# Patient Record
Sex: Female | Born: 2000 | Race: White | Hispanic: No | Marital: Single | Smoking: Never smoker
Health system: Southern US, Community
[De-identification: ages and names within clinical notes are randomized; demographics above are authoritative.]

## PROBLEM LIST (undated history)

## (undated) DIAGNOSIS — F32A Depression, unspecified: Secondary | ICD-10-CM

## (undated) DIAGNOSIS — F329 Major depressive disorder, single episode, unspecified: Secondary | ICD-10-CM

## (undated) DIAGNOSIS — F419 Anxiety disorder, unspecified: Secondary | ICD-10-CM

## (undated) DIAGNOSIS — J45909 Unspecified asthma, uncomplicated: Secondary | ICD-10-CM

## (undated) HISTORY — PX: OTHER SURGICAL HISTORY: SHX169

---
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
## 2022-02-01 ENCOUNTER — Other Ambulatory Visit (HOSPITAL_COMMUNITY): Payer: Self-pay | Admitting: Psychiatry

## 2022-02-03 ENCOUNTER — Other Ambulatory Visit (HOSPITAL_COMMUNITY): Payer: Self-pay | Admitting: Psychiatry

## 2022-02-05 ENCOUNTER — Other Ambulatory Visit (HOSPITAL_COMMUNITY): Payer: Self-pay

## 2022-02-05 MED ORDER — VENLAFAXINE HCL ER 37.5 MG PO CP24: ORAL_CAPSULE | ORAL | 0 refills | Status: DC

## 2022-03-04 ENCOUNTER — Other Ambulatory Visit (HOSPITAL_COMMUNITY): Payer: Self-pay | Admitting: Psychiatry

## 2022-04-05 ENCOUNTER — Encounter (HOSPITAL_COMMUNITY): Payer: Self-pay | Admitting: Psychiatry

## 2022-04-05 ENCOUNTER — Telehealth (INDEPENDENT_AMBULATORY_CARE_PROVIDER_SITE_OTHER): Payer: Medicaid Other | Admitting: Psychiatry

## 2022-04-05 DIAGNOSIS — F4322 Adjustment disorder with anxiety: Secondary | ICD-10-CM

## 2022-04-05 DIAGNOSIS — F331 Major depressive disorder, recurrent, moderate: Secondary | ICD-10-CM | POA: Diagnosis not present

## 2022-04-05 DIAGNOSIS — F411 Generalized anxiety disorder: Secondary | ICD-10-CM

## 2022-04-05 NOTE — Progress Notes (Signed)
[facility] Follow up visit   Patient Identification: Stephanie Jackson MRN:  [id number] Date of Evaluation:  04/05/2022 Referral Source: dr. Milana Kidney Chief Complaint:  follow up , med review and anxiety Visit Diagnosis:    ICD-10-CM   1. Moderate episode of recurrent major depressive disorder (HCC)  F33.1     2. Generalized anxiety disorder  F41.1     3. Adjustment disorder with anxious mood  F43.22     Virtual Visit via Video Note  I connected with [name] on 04/05/22 at 12:00 PM EDT by a video enabled telemedicine application and verified that I am speaking with the correct person using two identifiers.  Location: Patient: home Provider: home office   I discussed the limitations of evaluation and management by telemedicine and the availability of in person appointments. The patient expressed understanding and agreed to proceed.     I discussed the assessment and treatment plan with the patient. The patient was provided an opportunity to ask questions and all were answered. The patient agreed with the plan and demonstrated an understanding of the instructions.   The patient was advised to call back or seek an in-person evaluation if the symptoms worsen or if the condition fails to improve as anticipated.  I provided 15 minutes of non-face-to-face time during this encounter.    History of Present Illness: Patient is 21 years old female who lives with her mom and stepdad referred by Dr. Milana Kidney as she turns age 68 diagnosed with depressive disorder and anxiety along with adjustment  She plans to join [employer] college currently working part time.  Overall doing fair, expecting a baby sibling next month Some delayed sleep and goes to bed late, takes vistaril   Relationship with step dad is better   Aggravating factors; difficult childhood, she also had a miscarriage last year  Modifying factors; mom and step dad  Duration since age [age]      Past Psychiatric  History: depression, anxiety  Previous Psychotropic Medications: Yes   Substance Abuse History in the last 12 months:  No.  Consequences of Substance Abuse: NA  Past Medical History:  Past Medical History:  Diagnosis Date   Anxiety    Asthma    Depression     Past Surgical History:  Procedure Laterality Date   ureteral dilation      Family Psychiatric History: mom : bipolar  Family History: No family history on file.  Social History:   Social History   Socioeconomic History   Marital status: Single    Spouse name: Not on file   Number of children: Not on file   Years of education: Not on file   Highest education level: Not on file  Occupational History   Not on file  Tobacco Use   Smoking status: Never   Smokeless tobacco: Never  Vaping Use   Vaping Use: Never used  Substance and Sexual Activity   Alcohol use: No   Drug use: No   Sexual activity: Never  Other Topics Concern   Not on file  Social History Narrative   Not on file   Social Determinants of Health   Financial Resource Strain: Not on file  Food Insecurity: Not on file  Transportation Needs: Not on file  Physical Activity: Not on file  Stress: Not on file  Social Connections: Not on file     Allergies:  No Known Allergies  Metabolic Disorder Labs: No results found for: "HGBA1C", "MPG" No results found  for: "PROLACTIN" No results found for: "CHOL", "TRIG", "HDL", "CHOLHDL", "VLDL", "LDLCALC" No results found for: "TSH"  Therapeutic Level Labs: No results found for: "LITHIUM" No results found for: "CBMZ" No results found for: "VALPROATE"  Current Medications: Current Outpatient Medications  Medication Sig Dispense Refill   albuterol (VENTOLIN HFA) 108 (90 Base) MCG/ACT inhaler Inhale 2 puffs into the lungs every 4 (four) hours as needed for wheezing or shortness of breath (cough, shortness of breath or wheezing.). 6.7 g 3   hydrOXYzine (VISTARIL) 25 MG capsule TAKE 1 CAPSULE BY  MOUTH 3 TIMES DAILY AS NEEDED FOR ANXIETY. 90 capsule 0   NIKKI 3-0.02 MG tablet Take 1 tablet by mouth daily.     ondansetron (ZOFRAN-ODT) 8 MG disintegrating tablet Take 1 tablet (8 mg total) by mouth every 8 (eight) hours as needed for nausea. 12 tablet 0   venlafaxine XR (EFFEXOR-XR) 37.5 MG 24 hr capsule Take one each morning with 75mg  capsule for 112.5mg  90 capsule 0   venlafaxine XR (EFFEXOR-XR) 75 MG 24 hr capsule TAKE ON E DAY, IN ADDITION TO 37.5MG  90 capsule 0   No current facility-administered medications for this visit.     Psychiatric Specialty Exam: Review of Systems  Cardiovascular:  Negative for chest pain.  Psychiatric/Behavioral:  Negative for agitation, hallucinations and sleep disturbance.     There were no vitals taken for this visit.There is no height or weight on file to calculate BMI.  General Appearance: Casual  Eye Contact:  Fair  Speech:  Normal Rate  Volume:  Decreased  Mood:  Euthymic  Affect:  Constricted  Thought Process:  Goal Directed  Orientation:  Full (Time, Place, and Person)  Thought Content:  Rumination  Suicidal Thoughts:  No  Homicidal Thoughts:  No  Memory:  Immediate;   Fair  Judgement:  Fair  Insight:  Fair  Psychomotor Activity:  Normal  Concentration:  Concentration: Good  Recall:  Good  Fund of Knowledge:Good  Language: Fair  Akathisia:  No  Handed:    AIMS (if indicated):  not done  Assets:  Desire for Improvement Housing Physical Health  ADL's:  Intact  Cognition: WNL  Sleep:  Good   Screenings: GAD-7    Flowsheet Row Office Visit from 09/11/2017 in [facility] [place]  Total GAD-7 Score 12      PHQ2-9    Flowsheet Row Office Visit from 05/24/2021 in [facility] AT [place] Video Visit from 12/13/2020 in [facility] AT [place] Office Visit from 09/11/2017 in [facility] AT [place]  PHQ-2 Total Score 1 4  1   PHQ-9 Total Score -- 11 --      Flowsheet Row Video Visit from 04/05/2022 in [facility] AT [place] Video Visit from 12/07/2021 in [facility] AT [place] Office Visit from 05/24/2021 in [facility] AT [place]  C-SSRS RISK CATEGORY No Risk No Risk No Risk       Assessment and Plan: as follows  Prior documentation reviewed  Major depressive disorder recurrent moderate; in partial remission  Doing fair continue wellbutrin  Has refills Generalized anxiety disorder; manageable continue effexor Adjustment disorder: discussed sleep hygiene and to wake up earlier , avoid daytime naps, continue vistaril for sleep and anxiety for now Fu 31m. Call for refills 12/09/2021, MD 7/28/202312:18 PM

## 2022-04-28 ENCOUNTER — Other Ambulatory Visit (HOSPITAL_COMMUNITY): Payer: Self-pay | Admitting: Psychiatry

## 2022-05-28 ENCOUNTER — Other Ambulatory Visit (HOSPITAL_COMMUNITY): Payer: Self-pay

## 2022-05-28 MED ORDER — VENLAFAXINE HCL ER 37.5 MG PO CP24: ORAL_CAPSULE | ORAL | 0 refills | Status: DC

## 2022-05-28 MED ORDER — VENLAFAXINE HCL ER 75 MG PO CP24: ORAL_CAPSULE | ORAL | 0 refills | Status: DC

## 2022-07-08 ENCOUNTER — Telehealth (HOSPITAL_COMMUNITY): Payer: Self-pay | Admitting: Psychiatry

## 2022-07-08 ENCOUNTER — Encounter (HOSPITAL_COMMUNITY): Payer: Self-pay

## 2022-08-07 ENCOUNTER — Other Ambulatory Visit (HOSPITAL_COMMUNITY): Payer: Self-pay

## 2022-08-07 MED ORDER — HYDROXYZINE PAMOATE 25 MG PO CAPS: ORAL_CAPSULE | ORAL | 0 refills | Status: DC

## 2022-08-07 MED ORDER — VENLAFAXINE HCL ER 75 MG PO CP24: ORAL_CAPSULE | ORAL | 0 refills | Status: DC

## 2022-08-07 MED ORDER — VENLAFAXINE HCL ER 37.5 MG PO CP24: ORAL_CAPSULE | ORAL | 0 refills | Status: DC

## 2022-08-14 ENCOUNTER — Telehealth (HOSPITAL_COMMUNITY): Payer: Medicaid Other | Admitting: Psychiatry

## 2022-08-14 ENCOUNTER — Encounter (HOSPITAL_COMMUNITY): Payer: Self-pay

## 2022-09-29 ENCOUNTER — Other Ambulatory Visit (HOSPITAL_COMMUNITY): Payer: Self-pay | Admitting: Psychiatry

## 2022-10-10 ENCOUNTER — Telehealth (HOSPITAL_COMMUNITY): Payer: Self-pay | Admitting: *Deleted

## 2022-10-10 NOTE — Telephone Encounter (Signed)
Patient called requesting refill of:  venlafaxine XR (EFFEXOR-XR) 37.5 MG 24 hr capsule.   Last ordered: 08/07/2022 - 90 capsules  Last visit: 04/05/2022  Next visit: 10/14/2022

## 2022-10-11 MED ORDER — VENLAFAXINE HCL ER 37.5 MG PO CP24: ORAL_CAPSULE | ORAL | 0 refills | Status: DC

## 2022-10-14 ENCOUNTER — Telehealth (HOSPITAL_COMMUNITY): Payer: Medicaid Other | Admitting: Psychiatry

## 2022-10-16 ENCOUNTER — Encounter (HOSPITAL_COMMUNITY): Payer: Self-pay | Admitting: Psychiatry

## 2022-10-16 ENCOUNTER — Telehealth (INDEPENDENT_AMBULATORY_CARE_PROVIDER_SITE_OTHER): Payer: Medicaid Other | Admitting: Psychiatry

## 2022-10-16 DIAGNOSIS — F331 Major depressive disorder, recurrent, moderate: Secondary | ICD-10-CM

## 2022-10-16 DIAGNOSIS — F411 Generalized anxiety disorder: Secondary | ICD-10-CM | POA: Diagnosis not present

## 2022-10-16 DIAGNOSIS — F4322 Adjustment disorder with anxiety: Secondary | ICD-10-CM

## 2022-10-16 MED ORDER — TRAZODONE HCL 50 MG PO TABS: 25.0000 mg | ORAL_TABLET | Freq: Every day | ORAL | 0 refills | Status: DC

## 2022-10-16 NOTE — Progress Notes (Signed)
[place] Follow up visit   Patient Identification: Stephanie Jackson MRN:  [id number] Date of Evaluation:  10/16/2022 Referral Source: dr. Melanee Left Chief Complaint:  follow up , med review and anxiety Visit Diagnosis:    ICD-10-CM   1. Moderate episode of recurrent major depressive disorder (HCC)  F33.1     2. Generalized anxiety disorder  F41.1     3. Adjustment disorder with anxious mood  F43.22     Virtual Visit via Video Note  I connected with Stephanie Jackson on 10/16/22 at  1:00 PM EST by a video enabled telemedicine application and verified that I am speaking with the correct person using two identifiers.  Location: Patient: home with mom Provider: home office   I discussed the limitations of evaluation and management by telemedicine and the availability of in person appointments. The patient expressed understanding and agreed to proceed.      I discussed the assessment and treatment plan with the patient. The patient was provided an opportunity to ask questions and all were answered. The patient agreed with the plan and demonstrated an understanding of the instructions.   The patient was advised to call back or seek an in-person evaluation if the symptoms worsen or if the condition fails to improve as anticipated.  I provided 20 minutes of non-face-to-face time during this encounter.    History of Present Illness: Patient is 22 years old female who lives with her mom and stepdad referred by Dr. Melanee Left as she turns age 81 diagnosed with depressive disorder and anxiety along with adjustment  She plans to join [employer] college   As of now looking for part time job  Sleeps late, wakes up late Has a baby sibling new born, keeps busy  Vistaril for prn anxiety Depression better but feels tired or sleepy   Relationship with step dad is better   Aggravating factors; difficult childhood, she also had a miscarriage last year  Modifying factors; mom  Duration since age  [age]   Severity anxieyt better, still poor sleep or delayed sleep   Past Psychiatric History: depression, anxiety  Previous Psychotropic Medications: Yes   Substance Abuse History in the last 12 months:  No.  Consequences of Substance Abuse: NA  Past Medical History:  Past Medical History:  Diagnosis Date   Anxiety    Asthma    Depression     Past Surgical History:  Procedure Laterality Date   ureteral dilation      Family Psychiatric History: mom : bipolar  Family History: History reviewed. No pertinent family history.  Social History:   Social History   Socioeconomic History   Marital status: Single    Spouse name: Not on file   Number of children: Not on file   Years of education: Not on file   Highest education level: Not on file  Occupational History   Not on file  Tobacco Use   Smoking status: Never   Smokeless tobacco: Never  Vaping Use   Vaping Use: Never used  Substance and Sexual Activity   Alcohol use: No   Drug use: No   Sexual activity: Never  Other Topics Concern   Not on file  Social History Narrative   Not on file   Social Determinants of Health   Financial Resource Strain: Not on file  Food Insecurity: Not on file  Transportation Needs: Not on file  Physical Activity: Not on file  Stress: Not on file  Social Connections: Not on file  Allergies:  No Known Allergies  Metabolic Disorder Labs: No results found for: "HGBA1C", "MPG" No results found for: "PROLACTIN" No results found for: "CHOL", "TRIG", "HDL", "CHOLHDL", "VLDL", "LDLCALC" No results found for: "TSH"  Therapeutic Level Labs: No results found for: "LITHIUM" No results found for: "CBMZ" No results found for: "VALPROATE"  Current Medications: Current Outpatient Medications  Medication Sig Dispense Refill   traZODone (DESYREL) 50 MG tablet Take 0.5 tablets (25 mg total) by mouth at bedtime. 15 tablet 0   albuterol (VENTOLIN HFA) 108 (90 Base) MCG/ACT  inhaler Inhale 2 puffs into the lungs every 4 (four) hours as needed for wheezing or shortness of breath (cough, shortness of breath or wheezing.). 6.7 g 3   hydrOXYzine (VISTARIL) 25 MG capsule TAKE 1 CAPSULE BY MOUTH 3 TIMES DAILY AS NEEDED FOR ANXIETY. 270 capsule 1   NIKKI 3-0.02 MG tablet Take 1 tablet by mouth daily.     ondansetron (ZOFRAN-ODT) 8 MG disintegrating tablet Take 1 tablet (8 mg total) by mouth every 8 (eight) hours as needed for nausea. 12 tablet 0   venlafaxine XR (EFFEXOR-XR) 37.5 MG 24 hr capsule Take one each morning with 75mg  capsule for 112.5mg  strength. 90 capsule 0   venlafaxine XR (EFFEXOR-XR) 75 MG 24 hr capsule Take one in addition to the 37.5mg  90 capsule 0   No current facility-administered medications for this visit.     Psychiatric Specialty Exam: Review of Systems  Cardiovascular:  Negative for chest pain.  Psychiatric/Behavioral:  Negative for agitation, hallucinations and sleep disturbance.     There were no vitals taken for this visit.There is no height or weight on file to calculate BMI.  General Appearance: Casual  Eye Contact:  Fair  Speech:  Normal Rate  Volume:  Decreased  Mood:  Euthymic  Affect:  Constricted  Thought Process:  Goal Directed  Orientation:  Full (Time, Place, and Person)  Thought Content:  Rumination  Suicidal Thoughts:  No  Homicidal Thoughts:  No  Memory:  Immediate;   Fair  Judgement:  Fair  Insight:  Fair  Psychomotor Activity:  Normal  Concentration:  Concentration: Good  Recall:  Good  Fund of Knowledge:Good  Language: Fair  Akathisia:  No  Handed:    AIMS (if indicated):  not done  Assets:  Desire for Improvement Housing Physical Health  ADL's:  Intact  Cognition: WNL  Sleep:  Good   Screenings: GAD-7    Flowsheet Row Office Visit from 09/11/2017 in [place] at [facility]  Total GAD-7 Score 12      PHQ2-9    [place] Office Visit from 05/24/2021 in  [place] at [facility] Video Visit from 12/13/2020 in [place] at [place] Visit from 09/11/2017 in [place] at [facility]  PHQ-2 Total Score 1 4 1   PHQ-9 Total Score -- 11 --      Flowsheet Row Video Visit from 04/05/2022 in [place] at [facility] Video Visit from 12/07/2021 in [place] at [place] Visit from 05/24/2021 in [place] at [place] No Risk No Risk No Risk       Assessment and Plan: as follows  Prior documentation reviewed  Major depressive disorder recurrent moderate; in partial remission  Fair , gets sleepy, continue wellbutrin, work  on sleep hygiene   Has  refills Generalized anxiety disorder; fair on effexor, and vistaril avoid vistaril if sleepy  Adjustment disorder:with delayed sleep, discussed sleep hygiene, will add small dose of trazadone at night, avoid day time sleepiness  Fu 2 -59m.  Merian Capron, MD 2/7/20241:09 PM

## 2022-11-02 ENCOUNTER — Other Ambulatory Visit (HOSPITAL_COMMUNITY): Payer: Self-pay | Admitting: Psychiatry

## 2022-11-07 ENCOUNTER — Other Ambulatory Visit (HOSPITAL_COMMUNITY): Payer: Self-pay | Admitting: Psychiatry

## 2023-01-22 ENCOUNTER — Encounter (HOSPITAL_COMMUNITY): Payer: Self-pay | Admitting: Psychiatry

## 2023-01-22 ENCOUNTER — Telehealth (INDEPENDENT_AMBULATORY_CARE_PROVIDER_SITE_OTHER): Payer: Medicaid Other | Admitting: Psychiatry

## 2023-01-22 DIAGNOSIS — F331 Major depressive disorder, recurrent, moderate: Secondary | ICD-10-CM | POA: Diagnosis not present

## 2023-01-22 DIAGNOSIS — F411 Generalized anxiety disorder: Secondary | ICD-10-CM | POA: Diagnosis not present

## 2023-01-22 DIAGNOSIS — F4322 Adjustment disorder with anxiety: Secondary | ICD-10-CM

## 2023-01-22 MED ORDER — TRAZODONE HCL 50 MG PO TABS: ORAL_TABLET | ORAL | 0 refills | Status: DC

## 2023-01-22 MED ORDER — HYDROXYZINE PAMOATE 25 MG PO CAPS: 25.0000 mg | ORAL_CAPSULE | Freq: Two times a day (BID) | ORAL | 0 refills | Status: DC | PRN

## 2023-01-22 NOTE — Progress Notes (Signed)
[facility] Follow up visit   Patient Identification: Stephanie Jackson MRN:  [id number] Date of Evaluation:  01/22/2023 Referral Source: dr. Milana Kidney Chief Complaint:  follow up , med review and anxiety Visit Diagnosis:    ICD-10-CM   1. Moderate episode of recurrent major depressive disorder (HCC)  F33.1     2. Generalized anxiety disorder  F41.1     3. Adjustment disorder with anxious mood  F43.22      Virtual Visit via Video Note  I connected with Stephanie Jackson on 01/22/23 at  1:00 PM EDT by a video enabled telemedicine application and verified that I am speaking with the correct person using two identifiers.  Location: Patient: home Provider: home office   I discussed the limitations of evaluation and management by telemedicine and the availability of in person appointments. The patient expressed understanding and agreed to proceed.      I discussed the assessment and treatment plan with the patient. The patient was provided an opportunity to ask questions and all were answered. The patient agreed with the plan and demonstrated an understanding of the instructions.   The patient was advised to call back or seek an in-person evaluation if the symptoms worsen or if the condition fails to improve as anticipated.  I provided 15  minutes of non-face-to-face time during this encounter.    History of Present Illness: Patient is 22 years old female who lives with her mom and stepdad referred by Dr. Milana Kidney as she turns age 58 diagnosed with depressive disorder and anxiety along with adjustment   Doing fair, planning to go beach , not dwelling on past or worries Has a new puppy, also young born sibling brother   Vistaril for prn anxiety Depression better but feels tired or sleepy   Relationship with step dad is better   Aggravating factors; difficult childhood  Modifying factors; mom  Duration since age [age]   Severity : anxiety manageble  Past Psychiatric History:  depression, anxiety  Previous Psychotropic Medications: Yes   Substance Abuse History in the last 12 months:  No.  Consequences of Substance Abuse: NA  Past Medical History:  Past Medical History:  Diagnosis Date   Anxiety    Asthma    Depression     Past Surgical History:  Procedure Laterality Date   ureteral dilation      Family Psychiatric History: mom : bipolar  Family History: History reviewed. No pertinent family history.  Social History:   Social History   Socioeconomic History   Marital status: Single    Spouse name: Not on file   Number of children: Not on file   Years of education: Not on file   Highest education level: Not on file  Occupational History   Not on file  Tobacco Use   Smoking status: Never   Smokeless tobacco: Never  Vaping Use   Vaping Use: Never used  Substance and Sexual Activity   Alcohol use: No   Drug use: No   Sexual activity: Never  Other Topics Concern   Not on file  Social History Narrative   Not on file   Social Determinants of Health   Financial Resource Strain: Not on file  Food Insecurity: Not on file  Transportation Needs: Not on file  Physical Activity: Not on file  Stress: Not on file  Social Connections: Not on file     Allergies:  No Known Allergies  Metabolic Disorder Labs: No results found for: "HGBA1C", "MPG"  No results found for: "PROLACTIN" No results found for: "CHOL", "TRIG", "HDL", "CHOLHDL", "VLDL", "LDLCALC" No results found for: "TSH"  Therapeutic Level Labs: No results found for: "LITHIUM" No results found for: "CBMZ" No results found for: "VALPROATE"  Current Medications: Current Outpatient Medications  Medication Sig Dispense Refill   albuterol (VENTOLIN HFA) 108 (90 Base) MCG/ACT inhaler Inhale 2 puffs into the lungs every 4 (four) hours as needed for wheezing or shortness of breath (cough, shortness of breath or wheezing.). 6.7 g 3   hydrOXYzine (VISTARIL) 25 MG capsule Take 1  capsule (25 mg total) by mouth 2 (two) times daily as needed. 60 capsule 0   NIKKI 3-0.02 MG tablet Take 1 tablet by mouth daily.     ondansetron (ZOFRAN-ODT) 8 MG disintegrating tablet Take 1 tablet (8 mg total) by mouth every 8 (eight) hours as needed for nausea. 12 tablet 0   traZODone (DESYREL) 50 MG tablet TAKE 0.5 TABLETS BY MOUTH AT BEDTIME. 45 tablet 0   venlafaxine XR (EFFEXOR-XR) 37.5 MG 24 hr capsule Take one each morning with 75mg  capsule for 112.5mg  strength. 90 capsule 0   venlafaxine XR (EFFEXOR-XR) 75 MG 24 hr capsule TAKE ONE IN ADDITION TO THE 37.5MG  90 capsule 0   No current facility-administered medications for this visit.     Psychiatric Specialty Exam: Review of Systems  Cardiovascular:  Negative for chest pain.  Psychiatric/Behavioral:  Negative for agitation, hallucinations and sleep disturbance.     There were no vitals taken for this visit.There is no height or weight on file to calculate BMI.  General Appearance: Casual  Eye Contact:  Fair  Speech:  Normal Rate  Volume:  Decreased  Mood:  Euthymic  Affect:  Constricted  Thought Process:  Goal Directed  Orientation:  Full (Time, Place, and Person)  Thought Content:  Rumination  Suicidal Thoughts:  No  Homicidal Thoughts:  No  Memory:  Immediate;   Fair  Judgement:  Fair  Insight:  Fair  Psychomotor Activity:  Normal  Concentration:  Concentration: Good  Recall:  Good  Fund of Knowledge:Good  Language: Fair  Akathisia:  No  Handed:    AIMS (if indicated):  not done  Assets:  Desire for Improvement Housing Physical Health  ADL's:  Intact  Cognition: WNL  Sleep:  Good   Screenings: GAD-7    Flowsheet Row Office Visit from 09/11/2017 in [place] [facility]  Total GAD-7 Score 12      PHQ2-9    Flowsheet Row Office Visit from 05/24/2021 in [facility] at [facility] Video Visit from 12/13/2020 in [facility] at [facility] Office Visit from 09/11/2017 in [facility] at [facility]  PHQ-2 Total Score 1 4 1   PHQ-9 Total Score -- 11 --      Flowsheet Row Video Visit from 04/05/2022 in [facility] at [facility] Video Visit from 12/07/2021 in [facility] at [facility] Office Visit from 05/24/2021 in [facility] at [facility]  C-SSRS RISK CATEGORY No Risk No Risk No Risk       Assessment and Plan: as follows  Prior documentation reviewed  Major depressive disorder recurrent moderate; in partial remission  Doing better conitnue wellbutrin  Generalized anxiety disorder; fair on effexor continue and vistaril, lower it to bid prn  Adjustment disorder:with delayed sleep, fair discussed sleep hygiene, continue trazadone  Fu 32m  Thresa Ross, MD 5/15/20241:17 PM

## 2023-02-03 ENCOUNTER — Other Ambulatory Visit (HOSPITAL_COMMUNITY): Payer: Self-pay | Admitting: Psychiatry

## 2023-04-27 ENCOUNTER — Other Ambulatory Visit (HOSPITAL_COMMUNITY): Payer: Self-pay | Admitting: Psychiatry

## 2023-05-07 ENCOUNTER — Other Ambulatory Visit (HOSPITAL_COMMUNITY): Payer: Self-pay | Admitting: Psychiatry

## 2023-05-21 ENCOUNTER — Telehealth (HOSPITAL_COMMUNITY): Payer: Medicaid Other | Admitting: Psychiatry

## 2023-08-08 ENCOUNTER — Other Ambulatory Visit (HOSPITAL_COMMUNITY): Payer: Self-pay | Admitting: Psychiatry

## 2023-08-11 ENCOUNTER — Telehealth (HOSPITAL_COMMUNITY): Payer: Self-pay | Admitting: *Deleted

## 2023-08-11 NOTE — Telephone Encounter (Signed)
Patient LVM for Refills  Called # [phone] that appointment required for further refills

## 2023-08-13 ENCOUNTER — Other Ambulatory Visit (HOSPITAL_COMMUNITY): Payer: Self-pay | Admitting: Psychiatry

## 2023-09-22 ENCOUNTER — Telehealth (HOSPITAL_COMMUNITY): Payer: Self-pay | Admitting: *Deleted

## 2023-09-22 NOTE — Telephone Encounter (Signed)
 REFILL REQUEST & Rx CHANGE  [facility] [phone] - [place],  - [address]   venlafaxine XR (EFFEXOR-XR) 37.5 MG 24 hr capsule   venlafaxine XR (EFFEXOR-XR) 75 MG 24 hr capsule   NEXT APPT  07/23/24 LAST APPT  01/22/23

## 2023-09-23 ENCOUNTER — Encounter (HOSPITAL_COMMUNITY): Payer: Self-pay | Admitting: Psychiatry

## 2023-09-23 ENCOUNTER — Other Ambulatory Visit (HOSPITAL_COMMUNITY): Payer: Self-pay | Admitting: *Deleted

## 2023-09-23 ENCOUNTER — Telehealth (HOSPITAL_COMMUNITY): Payer: Medicaid Other | Admitting: Psychiatry

## 2023-09-23 DIAGNOSIS — F411 Generalized anxiety disorder: Secondary | ICD-10-CM | POA: Diagnosis not present

## 2023-09-23 DIAGNOSIS — F4322 Adjustment disorder with anxiety: Secondary | ICD-10-CM

## 2023-09-23 DIAGNOSIS — F331 Major depressive disorder, recurrent, moderate: Secondary | ICD-10-CM

## 2023-09-23 MED ORDER — VENLAFAXINE HCL ER 37.5 MG PO CP24: ORAL_CAPSULE | ORAL | 0 refills | Status: DC

## 2023-09-23 MED ORDER — VENLAFAXINE HCL ER 75 MG PO CP24: ORAL_CAPSULE | ORAL | 0 refills | Status: DC

## 2023-09-23 MED ORDER — HYDROXYZINE PAMOATE 25 MG PO CAPS: 25.0000 mg | ORAL_CAPSULE | Freq: Every day | ORAL | 0 refills | Status: DC | PRN

## 2023-09-23 NOTE — Telephone Encounter (Signed)
 SENT [facility]SIGN [facility] [id number] - , [place] - [address]   venlafaxine XR (EFFEXOR-XR) 37.5 MG 24 hr capsule   venlafaxine XR (EFFEXOR-XR) 75 MG 24 hr capsule

## 2023-09-23 NOTE — Progress Notes (Signed)
 [facility] Follow up visit   Patient Identification: Stephanie Jackson MRN:  [id number] Date of Evaluation:  09/23/2023 Referral Source: dr. Philis Chief Complaint:  follow up , med review and anxiety Visit Diagnosis:    ICD-10-CM   1. Moderate episode of recurrent major depressive disorder (HCC)  F33.1     2. Generalized anxiety disorder  F41.1     3. Adjustment disorder with anxious mood  F43.22      Virtual Visit via Video Note  I connected with Stephanie Jackson on 09/23/23 at  1:00 PM EST by a video enabled telemedicine application and verified that I am speaking with the correct person using two identifiers.  Location: Patient: car Provider: office   I discussed the limitations of evaluation and management by telemedicine and the availability of in person appointments. The patient expressed understanding and agreed to proceed.     I discussed the assessment and treatment plan with the patient. The patient was provided an opportunity to ask questions and all were answered. The patient agreed with the plan and demonstrated an understanding of the instructions.   The patient was advised to call back or seek an in-person evaluation if the symptoms worsen or if the condition fails to improve as anticipated.  I provided 18 minutes of non-face-to-face time during this encounter.      History of Present Illness: Patient is 23 years old female who lives with her mom and stepdad referred by Dr. Philis as she turns age 35 diagnosed with depressive disorder and anxiety along with adjustment   Last seen 7 months ago and then she didn't follow up or NO Show. Now returning as running low on meds and says she is pregnant 7 months. OB is following and check ups are good according to her Says she was not able to follow up but now can with her phone and living with fiance . Good support system and happy in general   Still takes vistaril  prn other wise effexor  regularly   Aggravating  factors; difficult childhood  Modifying factors; mom   Duration since age 60-15   Severity : manageable  Past Psychiatric History: depression, anxiety  Previous Psychotropic Medications: Yes   Substance Abuse History in the last 12 months:  No.  Consequences of Substance Abuse: NA  Past Medical History:  Past Medical History:  Diagnosis Date   Anxiety    Asthma    Depression     Past Surgical History:  Procedure Laterality Date   ureteral dilation      Family Psychiatric History: mom : bipolar  Family History: History reviewed. No pertinent family history.  Social History:   Social History   Socioeconomic History   Marital status: Single    Spouse name: Not on file   Number of children: Not on file   Years of education: Not on file   Highest education level: Not on file  Occupational History   Not on file  Tobacco Use   Smoking status: Never   Smokeless tobacco: Never  Vaping Use   Vaping status: Never Used  Substance and Sexual Activity   Alcohol use: No   Drug use: No   Sexual activity: Never  Other Topics Concern   Not on file  Social History Narrative   Not on file   Social Drivers of Health   Financial Resource Strain: Medium Risk (09/17/2023)   Received from [facility]   Overall Financial Resource Strain (CARDIA)    Difficulty  of Paying Living Expenses: Somewhat hard  Food Insecurity: Food Insecurity Present (09/17/2023)   Received [facility]   Hunger Vital Sign    Worried About Running Out of Food in the Last Year: Sometimes true    Ran Out of Food in the Last Year: Sometimes true  Transportation Needs: No Transportation Needs (09/17/2023)   Received from [facility]   PRAPARE - Transportation    Lack of Transportation (Medical): No    Lack of Transportation (Non-Medical): No  Physical Activity: Insufficiently Active (09/04/2023)   Received from [facility]   Exercise Vital Sign    Days of Exercise per Week: 3 days     Minutes of Exercise per Session: 20 min  Stress: Stress Concern Present (09/04/2023)   Received from [facility] - Occupational Stress Questionnaire    Feeling of Stress : To some extent  Social Connections: Socially Integrated (09/04/2023)   Received from [facility]   Social Network    How would you rate your social network (family, work, friends)?: Good participation with social networks     Allergies:  No Known Allergies  Metabolic Disorder Labs: No results found for: HGBA1C, MPG No results found for: PROLACTIN No results found for: CHOL, TRIG, HDL, CHOLHDL, VLDL, LDLCALC No results found for: TSH  Therapeutic Level Labs: No results found for: LITHIUM No results found for: CBMZ No results found for: VALPROATE  Current Medications: Current Outpatient Medications  Medication Sig Dispense Refill   albuterol  (VENTOLIN  HFA) 108 (90 Base) MCG/ACT inhaler Inhale 2 puffs into the lungs every 4 (four) hours as needed for wheezing or shortness of breath (cough, shortness of breath or wheezing.). 6.7 g 3   hydrOXYzine  (VISTARIL ) 25 MG capsule Take 1 capsule (25 mg total) by mouth daily as needed. 30 capsule 0   NIKKI 3-0.02 MG tablet Take 1 tablet by mouth daily.     ondansetron  (ZOFRAN -ODT) 8 MG disintegrating tablet Take 1 tablet (8 mg total) by mouth every 8 (eight) hours as needed for nausea. 12 tablet 0   traZODone  (DESYREL ) 50 MG tablet TAKE 1/2 TABLET BY MOUTH AT BEDTIME 45 tablet 0   venlafaxine  XR (EFFEXOR -XR) 75 MG 24 hr capsule TAKE ONE CAPSULE BY MOUTH DAILY IN ADDITION TO THE 37.5MG  90 capsule 0   No current facility-administered medications for this visit.     Psychiatric Specialty Exam: Review of Systems  Cardiovascular:  Negative for chest pain.  Psychiatric/Behavioral:  Negative for agitation, hallucinations and sleep disturbance.     There were no vitals taken for this visit.There is no  height or weight on file to calculate BMI.  General Appearance: Casual  Eye Contact:  Fair  Speech:  Normal Rate  Volume:  Decreased  Mood:  Euthymic  Affect:  Constricted  Thought Process:  Goal Directed  Orientation:  Full (Time, Place, and Person)  Thought Content:  Rumination  Suicidal Thoughts:  No  Homicidal Thoughts:  No  Memory:  Immediate;   Fair  Judgement:  Fair  Insight:  Fair  Psychomotor Activity:  Normal  Concentration:  Concentration: Good  Recall:  Good  Fund of Knowledge:Good  Language: Fair  Akathisia:  No  Handed:    AIMS (if indicated):  not done  Assets:  Desire for Improvement Housing Physical Health  ADL's:  Intact  Cognition: WNL  Sleep:  Good   Screenings: GAD-7    Flowsheet Row Office Visit from 09/11/2017 in [facility] at [facility]  Total GAD-7 Score 12      PHQ2-9    Flowsheet Row Office Visit from 05/24/2021 in [facility] at [facility] Video Visit from 12/13/2020 in [facility] at [facility] Office Visit from 09/11/2017 in [facility] at [facility]  PHQ-2 Total Score 1 4 1   PHQ-9 Total Score -- 11 --      Flowsheet Row Video Visit from 04/05/2022 in [facility] at [facility] Video Visit from 12/07/2021 in [facility] at [facility] Office Visit from 05/24/2021 in [facility] at [facility]  C-SSRS RISK CATEGORY No Risk No Risk No Risk       Assessment and Plan: as follows  Prior documentation reviewed  Major depressive disorder recurrent moderate;doing stable, considering small withdrawal effect of effexor  after delivery, we can lower the dose to 75mg  . Patient to observe for any concerns and call earlier , she was on 115mg  , also to lower trazadone if not  needed for sleep   Generalized anxiety disorder; fair on effexor  also lower vistaril  to 25mg  one a day prn.  She is into her 3rd trimester now  Adjustment disorder:with delayed sleep,; better takes half trazadone, continue sleep hygiene  Has good support system with Fiance and family Risk of meds while pregnant if any discussed and when in first trimester and patient aware Fu 70m or earlier if needed   Fu 16m  Jackey Flight, MD 1/14/20251:12 PM

## 2023-10-27 ENCOUNTER — Encounter (HOSPITAL_COMMUNITY): Payer: Self-pay | Admitting: Psychiatry

## 2023-10-27 ENCOUNTER — Telehealth (INDEPENDENT_AMBULATORY_CARE_PROVIDER_SITE_OTHER): Payer: Medicaid Other | Admitting: Psychiatry

## 2023-10-27 DIAGNOSIS — F331 Major depressive disorder, recurrent, moderate: Secondary | ICD-10-CM | POA: Diagnosis not present

## 2023-10-27 DIAGNOSIS — F4322 Adjustment disorder with anxiety: Secondary | ICD-10-CM | POA: Diagnosis not present

## 2023-10-27 DIAGNOSIS — F411 Generalized anxiety disorder: Secondary | ICD-10-CM | POA: Diagnosis not present

## 2023-10-27 MED ORDER — HYDROXYZINE PAMOATE 25 MG PO CAPS: 25.0000 mg | ORAL_CAPSULE | Freq: Every day | ORAL | 1 refills | Status: DC | PRN

## 2023-10-27 MED ORDER — TRAZODONE HCL 50 MG PO TABS: ORAL_TABLET | ORAL | 0 refills | Status: DC

## 2023-10-27 NOTE — Progress Notes (Signed)
[facility] Follow up visit   Patient Identification: Stephanie Jackson MRN:  [id number] Date of Evaluation:  10/27/2023 Referral Source: dr. Milana Kidney Chief Complaint:  follow up , med review and anxiety Visit Diagnosis:    ICD-10-CM   1. Moderate episode of recurrent major depressive disorder (HCC)  F33.1     2. Generalized anxiety disorder  F41.1     3. Adjustment disorder with anxious mood  F43.22     Virtual Visit via Video Note  I connected with Arlis Porta Cotroneo on 10/27/23 at  9:00 AM EST by a video enabled telemedicine application and verified that I am speaking with the correct person using two identifiers.  Location: Patient: home Provider: home office   I discussed the limitations of evaluation and management by telemedicine and the availability of in person appointments. The patient expressed understanding and agreed to proceed.      I discussed the assessment and treatment plan with the patient. The patient was provided an opportunity to ask questions and all were answered. The patient agreed with the plan and demonstrated an understanding of the instructions.   The patient was advised to call back or seek an in-person evaluation if the symptoms worsen or if the condition fails to improve as anticipated.  I provided 18 minutes of non-face-to-face time during this encounter.      History of Present Illness: Patient is 23 years old female who lives with her mom and stepdad referred by Dr. Milana Kidney as she turns age 9 diagnosed with depressive disorder and anxiety along with adjustment   Patient pregnant 6 months, says all is well, follows with OB Aware of meds and we have reduced doses of effexor/ trazadone last visit Depression and anxiety manageable     Still takes vistaril prn other wise effexor regularly   Aggravating factors; difficult childhood  Modifying factors; mom, BF  Duration since age [age]   Severity : manageable  Past Psychiatric History:  depression, anxiety  Previous Psychotropic Medications: Yes   Substance Abuse History in the last 12 months:  No.  Consequences of Substance Abuse: NA  Past Medical History:  Past Medical History:  Diagnosis Date   Anxiety    Asthma    Depression     Past Surgical History:  Procedure Laterality Date   ureteral dilation      Family Psychiatric History: mom : bipolar  Family History: No family history on file.  Social History:   Social History   Socioeconomic History   Marital status: Single    Spouse name: Not on file   Number of children: Not on file   Years of education: Not on file   Highest education level: Not on file  Occupational History   Not on file  Tobacco Use   Smoking status: Never   Smokeless tobacco: Never  Vaping Use   Vaping status: Never Used  Substance and Sexual Activity   Alcohol use: No   Drug use: No   Sexual activity: Never  Other Topics Concern   Not on file  Social History Narrative   Not on file   Social Drivers of Health   Financial Resource Strain: Medium Risk (09/17/2023)   Received from [facility]   Overall Financial Resource Strain (CARDIA)    Difficulty of Paying Living Expenses: Somewhat hard  Food Insecurity: Food Insecurity Present (09/17/2023)   Received [facility]   Hunger Vital Sign    Worried About Running Out of Food in the  Last Year: Sometimes true    Ran Out of Food in the Last Year: Sometimes true  Transportation Needs: No Transportation Needs (09/17/2023)   [facility] - Transportation    Lack of Transportation (Medical): No    Lack of Transportation (Non-Medical): No  Physical Activity: Insufficiently Active (09/04/2023)   Received from [facility]   Exercise Vital Sign    Days of Exercise per Week: 3 days    Minutes of Exercise per Session: 20 min  Stress: Stress Concern Present (09/04/2023)   Received from [facility] -  Occupational Stress Questionnaire    Feeling of Stress : To some extent  Social Connections: Socially Integrated (09/04/2023)   Received from [facility]   Social Network    How would you rate your social network (family, work, friends)?: Good participation with social networks     Allergies:  No Known Allergies  Metabolic Disorder Labs: No results found for: "HGBA1C", "MPG" No results found for: "PROLACTIN" No results found for: "CHOL", "TRIG", "HDL", "CHOLHDL", "VLDL", "LDLCALC" No results found for: "TSH"  Therapeutic Level Labs: No results found for: "LITHIUM" No results found for: "CBMZ" No results found for: "VALPROATE"  Current Medications: Current Outpatient Medications  Medication Sig Dispense Refill   albuterol (VENTOLIN HFA) 108 (90 Base) MCG/ACT inhaler Inhale 2 puffs into the lungs every 4 (four) hours as needed for wheezing or shortness of breath (cough, shortness of breath or wheezing.). 6.7 g 3   hydrOXYzine (VISTARIL) 25 MG capsule Take 1 capsule (25 mg total) by mouth daily as needed. 30 capsule 1   NIKKI 3-0.02 MG tablet Take 1 tablet by mouth daily.     ondansetron (ZOFRAN-ODT) 8 MG disintegrating tablet Take 1 tablet (8 mg total) by mouth every 8 (eight) hours as needed for nausea. 12 tablet 0   traZODone (DESYREL) 50 MG tablet TAKE 1/2 TABLET BY MOUTH AT BEDTIME 45 tablet 0   venlafaxine XR (EFFEXOR-XR) 75 MG 24 hr capsule TAKE ONE CAPSULE BY MOUTH DAILY IN ADDITION TO THE 37.5MG  90 capsule 0   No current facility-administered medications for this visit.     Psychiatric Specialty Exam: Review of Systems  Cardiovascular:  Negative for chest pain.  Psychiatric/Behavioral:  Negative for agitation, hallucinations and sleep disturbance.     There were no vitals taken for this visit.There is no height or weight on file to calculate BMI.  General Appearance: Casual  Eye Contact:  Fair  Speech:  Normal Rate  Volume:  Decreased  Mood:  Euthymic  Affect:   Constricted  Thought Process:  Goal Directed  Orientation:  Full (Time, Place, and Person)  Thought Content:  Rumination  Suicidal Thoughts:  No  Homicidal Thoughts:  No  Memory:  Immediate;   Fair  Judgement:  Fair  Insight:  Fair  Psychomotor Activity:  Normal  Concentration:  Concentration: Good  Recall:  Good  Fund of Knowledge:Good  Language: Fair  Akathisia:  No  Handed:    AIMS (if indicated):  not done  Assets:  Desire for Improvement Housing Physical Health  ADL's:  Intact  Cognition: WNL  Sleep:  Good   Screenings: GAD-7    Flowsheet Row Office Visit from 09/11/2017 in [facility] at [facility]  Total GAD-7 Score 12      PHQ2-9    Flowsheet Row Office Visit from 05/24/2021 in [facility] at [facility]  [place] Video Visit from 12/13/2020 in [facility] at [facility] Office Visit from 09/11/2017 in [facility] at [facility]  PHQ-2 Total Score 1 4 1   PHQ-9 Total Score -- 11 --      Flowsheet Row Video Visit from 09/23/2023 in [facility]al [facility] at [facility] Video Visit from 04/05/2022 in [facility] Outpatient Behavioral Health at [facility] Video Visit from 12/07/2021 in [facility] Outpatient Behavioral Health at [facility]  C-SSRS RISK CATEGORY No Risk No Risk No Risk       Assessment and Plan: as follows  Prior documentation reviewed  Major depressive disorder recurrent moderate; stable, continue effexor 25m. Pregnancy going well   Generalized anxiety disorder; manageable continue effexor and prn vistaril  No side effects, follow with OB to continue prenatal care  Adjustment disorder:with delayed sleep, improved, continue sleep hygiene Has good support system with Fiance and family Risk of meds while pregnant if any discussed and when in first  trimester and patient aware Fu 2 m or earlier if needed.    Fu 9m  Thresa Ross, MD 2/17/20259:05 AM

## 2023-12-14 ENCOUNTER — Other Ambulatory Visit (HOSPITAL_COMMUNITY): Payer: Self-pay | Admitting: Psychiatry

## 2023-12-15 ENCOUNTER — Telehealth (HOSPITAL_COMMUNITY): Payer: Self-pay | Admitting: *Deleted

## 2023-12-15 NOTE — Telephone Encounter (Signed)
 Rx REFILL REQUEST [id number] - [place], [place] - [address]   venlafaxine XR (EFFEXOR-XR) 75 MG 24 hr capsule   NEXT APPT 12-24-23 LAST APPT 10-27-23

## 2023-12-17 ENCOUNTER — Telehealth (HOSPITAL_COMMUNITY): Payer: Self-pay | Admitting: *Deleted

## 2023-12-17 MED ORDER — VENLAFAXINE HCL ER 75 MG PO CP24: ORAL_CAPSULE | ORAL | 0 refills | Status: DC

## 2023-12-17 NOTE — Addendum Note (Signed)
 Addended by: Thresa Ross on: 12/17/2023 02:45 PM   Modules accepted: Orders

## 2023-12-17 NOTE — Telephone Encounter (Signed)
 Rx REQUEST [facility] [phone] - [place], [place] - [address]   venlafaxine XR (EFFEXOR-XR) 75 MG 24 hr capsule   NEXT APPT 12-24-23 LAST APPT 10-27-23

## 2023-12-23 ENCOUNTER — Other Ambulatory Visit (HOSPITAL_COMMUNITY): Payer: Self-pay | Admitting: *Deleted

## 2023-12-23 ENCOUNTER — Telehealth (HOSPITAL_COMMUNITY): Payer: Self-pay | Admitting: *Deleted

## 2023-12-23 MED ORDER — VENLAFAXINE HCL ER 75 MG PO CP24: ORAL_CAPSULE | ORAL | 0 refills | Status: DC

## 2023-12-23 NOTE — Telephone Encounter (Signed)
 Provider Authorized  venlafaxine XR (EFFEXOR-XR) 75 MG 24 hr capsule  Sent Co Sign

## 2023-12-24 ENCOUNTER — Encounter (HOSPITAL_COMMUNITY): Payer: Self-pay | Admitting: Psychiatry

## 2023-12-24 ENCOUNTER — Telehealth (HOSPITAL_COMMUNITY): Payer: Medicaid Other | Admitting: Psychiatry

## 2023-12-24 DIAGNOSIS — F331 Major depressive disorder, recurrent, moderate: Secondary | ICD-10-CM

## 2023-12-24 DIAGNOSIS — F4322 Adjustment disorder with anxiety: Secondary | ICD-10-CM

## 2023-12-24 DIAGNOSIS — F411 Generalized anxiety disorder: Secondary | ICD-10-CM | POA: Diagnosis not present

## 2023-12-24 MED ORDER — VENLAFAXINE HCL ER 37.5 MG PO CP24: 37.5000 mg | ORAL_CAPSULE | Freq: Every day | ORAL | 0 refills | Status: DC

## 2023-12-24 MED ORDER — TRAZODONE HCL 50 MG PO TABS: ORAL_TABLET | ORAL | 0 refills | Status: DC

## 2023-12-24 NOTE — Progress Notes (Signed)
 [facility] Follow up visit   Patient Identification: Stephanie Jackson MRN:  [id number] Date of Evaluation:  12/24/2023 Referral Source: dr. Milana Kidney Chief Complaint:  follow up , med review and anxiety Visit Diagnosis:    ICD-10-CM   1. Moderate episode of recurrent major depressive disorder (HCC)  F33.1     2. Generalized anxiety disorder  F41.1     3. Adjustment disorder with anxious mood  F43.22     Virtual Visit via Video Note  I connected with Stephanie Jackson on 12/24/23 at 12:30 PM EDT by a video enabled telemedicine application and verified that I am speaking with the correct person using two identifiers.  Location: Patient: home Provider: home office   I discussed the limitations of evaluation and management by telemedicine and the availability of in person appointments. The patient expressed understanding and agreed to proceed.     I discussed the assessment and treatment plan with the patient. The patient was provided an opportunity to ask questions and all were answered. The patient agreed with the plan and demonstrated an understanding of the instructions.   The patient was advised to call back or seek an in-person evaluation if the symptoms worsen or if the condition fails to improve as anticipated.  I provided 18 minutes of non-face-to-face time during this encounter.       History of Present Illness: Patient is 23 years old female who lives with her mom and stepdad referred by Dr. Dennis Bast with depressive disorder and anxiety along with adjustment  She is 2 weeks post partum, she stopped effexor 2 months ago, now back as felt depressed, sad feeling post partum depression. OB also added 37.5mg  on top of 75mg  Some better last 2 days. Bonding is fair, baby doing well 2 weeks early delivery and is breast feeding Good support from husband and his family Sleep irregular and still wants trazadone   Aggravating factors; difficult childhood  Modifying factors;  mom, BF  Duration since age 49-15   Severity : somewhat subdued, no delusions or harmful thoughts to self or baby  Past Psychiatric History: depression, anxiety  Previous Psychotropic Medications: Yes   Substance Abuse History in the last 12 months:  No.  Consequences of Substance Abuse: NA  Past Medical History:  Past Medical History:  Diagnosis Date   Anxiety    Asthma    Depression     Past Surgical History:  Procedure Laterality Date   ureteral dilation      Family Psychiatric History: mom : bipolar  Family History: History reviewed. No pertinent family history.  Social History:   Social History   Socioeconomic History   Marital status: Single    Spouse name: Not on file   Number of children: Not on file   Years of education: Not on file   Highest education level: Not on file  Occupational History   Not on file  Tobacco Use   Smoking status: Never   Smokeless tobacco: Never  Vaping Use   Vaping status: Never Used  Substance and Sexual Activity   Alcohol use: No   Drug use: No   Sexual activity: Never  Other Topics Concern   Not on file  Social History Narrative   Not on file   Social Drivers of Health   Financial Resource Strain: Low Risk  (12/06/2023)   Received from [facility]   Overall Financial Resource Strain (CARDIA)    Difficulty of Paying Living Expenses: Not hard at all  Recent Concern: Financial Resource Strain - Medium Risk (09/17/2023)   Received from [facility]   Overall Financial Resource Strain (CARDIA)    Difficulty of Paying Living Expenses: Somewhat hard  Food Insecurity: No Food Insecurity (12/06/2023)   Received from [facility]   Hunger Vital Sign    Worried About Running Out of Food in the Last Year: Never true    Ran Out of Food in the Last Year: Never true  Recent Concern: Food Insecurity - Food Insecurity Present (09/17/2023)   Received from [facility]   Hunger Vital Sign    Worried About Running Out of Food  in the Last Year: Sometimes true    Ran Out of Food in the Last Year: Sometimes true  Transportation Needs: No Transportation Needs (12/06/2023)   Received from [facility] - Transportation    Lack of Transportation (Medical): No    Lack of Transportation (Non-Medical): No  Physical Activity: Insufficiently Active (09/04/2023)   Received from [facility]   Exercise Vital Sign    Days of Exercise per Week: 3 days    Minutes of Exercise per Session: 20 min  Stress: Stress Concern Present (12/06/2023)   Received from [facility] - Occupational Stress Questionnaire    Feeling of Stress : To some extent  Social Connections: Socially Integrated (09/04/2023)   Received from [facility]   Social Network    How would you rate your social network (family, work, friends)?: Good participation with social networks     Allergies:  No Known Allergies  Metabolic Disorder Labs: No results found for: "HGBA1C", "MPG" No results found for: "PROLACTIN" No results found for: "CHOL", "TRIG", "HDL", "CHOLHDL", "VLDL", "LDLCALC" No results found for: "TSH"  Therapeutic Level Labs: No results found for: "LITHIUM" No results found for: "CBMZ" No results found for: "VALPROATE"  Current Medications: Current Outpatient Medications  Medication Sig Dispense Refill   albuterol (VENTOLIN HFA) 108 (90 Base) MCG/ACT inhaler Inhale 2 puffs into the lungs every 4 (four) hours as needed for wheezing or shortness of breath (cough, shortness of breath or wheezing.). 6.7 g 3   hydrOXYzine (VISTARIL) 25 MG capsule Take 1 capsule (25 mg total) by mouth daily as needed. 30 capsule 1   NIKKI 3-0.02 MG tablet Take 1 tablet by mouth daily.     ondansetron (ZOFRAN-ODT) 8 MG disintegrating tablet Take 1 tablet (8 mg total) by mouth every 8 (eight) hours as needed for nausea. 12 tablet 0   traZODone (DESYREL) 50 MG tablet TAKE 1/2 TABLET BY MOUTH AT BEDTIME 45  tablet 0   venlafaxine XR (EFFEXOR-XR) 37.5 MG 24 hr capsule Take 1 capsule (37.5 mg total) by mouth daily with breakfast. 30 capsule 0   venlafaxine XR (EFFEXOR-XR) 75 MG 24 hr capsule TAKE ONE CAPSULE BY MOUTH DAILY IN ADDITION TO THE 37.5MG  90 capsule 0   No current facility-administered medications for this visit.     Psychiatric Specialty Exam: Review of Systems  Cardiovascular:  Negative for chest pain.  Psychiatric/Behavioral:  Positive for dysphoric mood. Negative for agitation, hallucinations and sleep disturbance.     There were no vitals taken for this visit.There is no height or weight on file to calculate BMI.  General Appearance: Casual  Eye Contact:  Fair  Speech:  Normal Rate  Volume:  Decreased  Mood: somewhat subdued  Affect:  Constricted  Thought Process:  Goal Directed  Orientation:  Full (Time, Place,  and Person)  Thought Content:  Rumination  Suicidal Thoughts:  No  Homicidal Thoughts:  No  Memory:  Immediate;   Fair  Judgement:  Fair  Insight:  Fair  Psychomotor Activity:  Normal  Concentration:  Concentration: Good  Recall:  Good  Fund of Knowledge:Good  Language: Fair  Akathisia:  No  Handed:    AIMS (if indicated):  not done  Assets:  Desire for Improvement Housing Physical Health  ADL's:  Intact  Cognition: WNL  Sleep:  Good   Screenings: GAD-7    Flowsheet Row Office Visit from 09/11/2017 in [facility] at [facility]  Total GAD-7 Score 12      PHQ2-9    Flowsheet Row Office Visit from 05/24/2021 in [facility] Video Visit from 12/13/2020 in [facility] at [facility] Office Visit from 09/11/2017 in [facility] at [facility]  PHQ-2 Total Score 1 4 1   PHQ-9 Total Score -- 11 --      Flowsheet Row Video Visit from 10/27/2023 in [facility]  at [facility] Video Visit from 09/23/2023 in [facility] Behavior[facility] Health at [facility] Video Visit from 04/05/2022 in [facility] at [facility]  C-SSRS RISK CATEGORY No Risk No Risk No Risk       Assessment and Plan: as follows Prior documentation reviewed  Major depressive disorder recurrent moderate; somewhat subdued but recently has increased effexor, will observe and some better with increase, call earlier if needed Discussed to have support from husband taking care of baby Add ME time   Generalized anxiety disorder; manageable continue vistaril prn and is on effexor  Adjustment disorder:with delayed sleep, ; irregular more so during after delivery , reviewed sleep hygiene is also on trazadone, divide sleep time and adjust with baby and husband schedule  FU 1 m or earlier if needed Bonding is going well    Wray Heady, MD 4/16/202512:40 PM

## 2024-01-01 ENCOUNTER — Other Ambulatory Visit (HOSPITAL_COMMUNITY): Payer: Self-pay | Admitting: Psychiatry

## 2024-01-21 ENCOUNTER — Telehealth (HOSPITAL_COMMUNITY): Admitting: Psychiatry

## 2024-01-21 ENCOUNTER — Encounter (HOSPITAL_COMMUNITY): Payer: Self-pay

## 2024-01-27 ENCOUNTER — Telehealth (INDEPENDENT_AMBULATORY_CARE_PROVIDER_SITE_OTHER): Admitting: Psychiatry

## 2024-01-27 ENCOUNTER — Encounter (HOSPITAL_COMMUNITY): Payer: Self-pay | Admitting: Psychiatry

## 2024-01-27 DIAGNOSIS — F331 Major depressive disorder, recurrent, moderate: Secondary | ICD-10-CM | POA: Diagnosis not present

## 2024-01-27 DIAGNOSIS — F4322 Adjustment disorder with anxiety: Secondary | ICD-10-CM | POA: Diagnosis not present

## 2024-01-27 DIAGNOSIS — F411 Generalized anxiety disorder: Secondary | ICD-10-CM

## 2024-01-27 MED ORDER — TRAZODONE HCL 50 MG PO TABS: ORAL_TABLET | ORAL | 0 refills | Status: DC

## 2024-01-27 MED ORDER — VENLAFAXINE HCL ER 150 MG PO CP24: ORAL_CAPSULE | ORAL | 0 refills | Status: DC

## 2024-01-27 NOTE — Progress Notes (Signed)
 [facility] Follow up visit   Patient Identification: Stephanie Jackson MRN:  [id number] Date of Evaluation:  01/27/2024 Referral Source: dr. Luvenia Salvage Chief Complaint:  follow up , med review and anxiety Visit Diagnosis:    ICD-10-CM   1. Moderate episode of recurrent major depressive disorder (HCC)  F33.1     2. Generalized anxiety disorder  F41.1     3. Adjustment disorder with anxious mood  F43.22     Virtual Visit via Video Note  I connected with Prestina N Pieroni on 01/27/24 at  2:30 PM EDT by a video enabled telemedicine application and verified that I am speaking with the correct person using two identifiers.  Location: Patient: home Provider: home office   I discussed the limitations of evaluation and management by telemedicine and the availability of in person appointments. The patient expressed understanding and agreed to proceed.         I discussed the assessment and treatment plan with the patient. The patient was provided an opportunity to ask questions and all were answered. The patient agreed with the plan and demonstrated an understanding of the instructions.   The patient was advised to call back or seek an in-person evaluation if the symptoms worsen or if the condition fails to improve as anticipated.  I provided 18 minutes of non-face-to-face time during this encounter.    History of Present Illness: Patient is 23 years old female diagnosed with depressive disorder and anxiety along with adjustment Currently lives with Fiance and his parents and post partum 2 months  Bonding is well, was feeling subdued last visit effexor  was increased some better but gets overwhelmed, anxious Has good support from husband and his family   Aggravating factors; difficult childhood  Modifying factors; mom, Fianc  Duration since age 23-15   Severity : stressed  Past Psychiatric History: depression, anxiety  Previous Psychotropic Medications: Yes   Substance Abuse  History in the last 12 months:  No.  Consequences of Substance Abuse: NA  Past Medical History:  Past Medical History:  Diagnosis Date   Anxiety    Asthma    Depression     Past Surgical History:  Procedure Laterality Date   ureteral dilation      Family Psychiatric History: mom : bipolar  Family History: History reviewed. No pertinent family history.  Social History:   Social History   Socioeconomic History   Marital status: Single    Spouse name: Not on file   Number of children: Not on file   Years of education: Not on file   Highest education level: Not on file  Occupational History   Not on file  Tobacco Use   Smoking status: Never   Smokeless tobacco: Never  Vaping Use   Vaping status: Never Used  Substance and Sexual Activity   Alcohol use: No   Drug use: No   Sexual activity: Never  Other Topics Concern   Not on file  Social History Narrative   Not on file   Social Drivers of Health   Financial Resource Strain: Low Risk  (12/06/2023)   [facility]   Overall Financial Resource Strain (CARDIA)    Difficulty of Paying Living Expenses: Not hard at all  Recent Concern: Financial Resource Strain - Medium Risk (09/17/2023)   Received from [facility]   Overall Financial Resource Strain (CARDIA)    Difficulty of Paying Living Expenses: Somewhat hard  Food Insecurity: No Food Insecurity (12/06/2023)   Received from  [facility]   Hunger Vital Sign    Worried About Running Out of Food in the Last Year: Never true    Ran Out of Food in the Last Year: Never true  Recent Concern: Food Insecurity - Food Insecurity Present (09/17/2023)   Received from [facility]   Hunger Vital Sign    Worried About Running Out of Food in the Last Year: Sometimes true    Ran Out of Food in the Last Year: Sometimes true  Transportation Needs: No Transportation Needs (12/06/2023)   Received from [facility] - Transportation    Lack of  Transportation (Medical): No    Lack of Transportation (Non-Medical): No  Physical Activity: Insufficiently Active (09/04/2023)   Received from [facility]   Exercise Vital Sign    Days of Exercise per Week: 3 days    Minutes of Exercise per Session: 20 min  Stress: Stress Concern Present (12/06/2023)   Received from [facility] - Occupational Stress Questionnaire    Feeling of Stress : To some extent  Social Connections: Socially Integrated (09/04/2023)   Received from [facility]   Social Network    How would you rate your social network (family, work, friends)?: Good participation with social networks     Allergies:  No Known Allergies  Metabolic Disorder Labs: No results found for: "HGBA1C", "MPG" No results found for: "PROLACTIN" No results found for: "CHOL", "TRIG", "HDL", "CHOLHDL", "VLDL", "LDLCALC" No results found for: "TSH"  Therapeutic Level Labs: No results found for: "LITHIUM" No results found for: "CBMZ" No results found for: "VALPROATE"  Current Medications: Current Outpatient Medications  Medication Sig Dispense Refill   albuterol  (VENTOLIN  HFA) 108 (90 Base) MCG/ACT inhaler Inhale 2 puffs into the lungs every 4 (four) hours as needed for wheezing or shortness of breath (cough, shortness of breath or wheezing.). 6.7 g 3   hydrOXYzine  (VISTARIL ) 25 MG capsule TAKE 1 CAPSULE (25 MG TOTAL) BY MOUTH DAILY AS NEEDED. 90 capsule 0   NIKKI 3-0.02 MG tablet Take 1 tablet by mouth daily.     ondansetron  (ZOFRAN -ODT) 8 MG disintegrating tablet Take 1 tablet (8 mg total) by mouth every 8 (eight) hours as needed for nausea. 12 tablet 0   traZODone  (DESYREL ) 50 MG tablet TAKE 1/2 TABLET BY MOUTH AT BEDTIME 45 tablet 0   venlafaxine  XR (EFFEXOR -XR) 150 MG 24 hr capsule TAKE ONE CAPSULE BY MOUTH DAILY IN ADDITION TO THE 37.5MG  30 capsule 0   No current facility-administered medications for this visit.     Psychiatric  Specialty Exam: Review of Systems  Cardiovascular:  Negative for chest pain.  Psychiatric/Behavioral:  Positive for dysphoric mood. Negative for agitation, hallucinations and sleep disturbance.     There were no vitals taken for this visit.There is no height or weight on file to calculate BMI.  General Appearance: Casual  Eye Contact:  Fair  Speech:  Normal Rate  Volume:  Decreased  Mood: somewhat subdued/ stressed  Affect:  Constricted  Thought Process:  Goal Directed  Orientation:  Full (Time, Place, and Person)  Thought Content:  Rumination  Suicidal Thoughts:  No  Homicidal Thoughts:  No  Memory:  Immediate;   Fair  Judgement:  Fair  Insight:  Fair  Psychomotor Activity:  Normal  Concentration:  Concentration: Good  Recall:  Good  Fund of Knowledge:Good  Language: Fair  Akathisia:  No  Handed:    AIMS (if indicated):  not done  Assets:  Desire for Improvement Housing Physical Health  ADL's:  Intact  Cognition: WNL  Sleep:  Good   Screenings: GAD-7    Flowsheet Row Office Visit from 09/11/2017 in [facility]  Total GAD-7 Score 12      PHQ2-9    Flowsheet Row Office Visit from 05/24/2021 in [facility] at [facility] Video Visit from 12/13/2020 in [facility] at [facility] Office Visit from 09/11/2017 in [facility] at [facility]  PHQ-2 Total Score 1 4 1   PHQ-9 Total Score -- 11 --      Flowsheet Row Video Visit from 12/24/2023 in [facility] at [facility] Video Visit from 10/27/2023 in [facility] at [facility] Video Visit from 09/23/2023 in [facility] at [facility]  C-SSRS RISK CATEGORY No Risk No Risk No Risk       Assessment and Plan: as follows  Prior documentation  reviewed  Major depressive disorder recurrent moderate; somewhat subdued, gets stressed increase effexor  to 150mg , consider therapy   Discussed to have support from husband taking care of baby Add ME time   Generalized anxiety disorder; gets anxious, increase effexor  to 150mg    Adjustment disorder:with delayed sleep, ; irregular at times, continue sleep hygiene and trazadone if tolerates ok   FU 1 m or earlier if needed     Wray Heady, MD 5/20/20252:41 PM

## 2024-02-20 ENCOUNTER — Other Ambulatory Visit (HOSPITAL_COMMUNITY): Payer: Self-pay | Admitting: Psychiatry

## 2024-02-27 ENCOUNTER — Encounter (HOSPITAL_COMMUNITY): Payer: Self-pay

## 2024-02-27 ENCOUNTER — Telehealth (HOSPITAL_COMMUNITY): Admitting: Psychiatry

## 2024-04-11 ENCOUNTER — Other Ambulatory Visit (HOSPITAL_COMMUNITY): Payer: Self-pay | Admitting: Psychiatry

## 2024-05-28 ENCOUNTER — Telehealth (HOSPITAL_COMMUNITY): Payer: Self-pay | Admitting: Psychiatry

## 2024-05-28 ENCOUNTER — Other Ambulatory Visit (HOSPITAL_COMMUNITY): Payer: Self-pay | Admitting: Psychiatry

## 2024-05-28 MED ORDER — VENLAFAXINE HCL ER 150 MG PO CP24: ORAL_CAPSULE | ORAL | 0 refills | Status: DC

## 2024-05-28 NOTE — Telephone Encounter (Signed)
 sent

## 2024-05-28 NOTE — Telephone Encounter (Signed)
 Received fax from patient's pharmacy requesting refill of venlafaxine  XR (EFFEXOR -XR) 150 MG 24 hr capsule .   [id number] - [place], [place] - [address] (Ph: [phone])   Last ordered: 02/23/2024 - 90 capsules Last visit: 01/27/2024  Next visit: None scheduled. No-show 02/27/2024. Left voicemail for patient requesting call back to schedule.

## 2024-05-29 ENCOUNTER — Encounter (HOSPITAL_COMMUNITY): Payer: Self-pay

## 2024-05-29 ENCOUNTER — Other Ambulatory Visit: Payer: Self-pay | Admitting: Medical Genetics

## 2024-06-01 ENCOUNTER — Encounter (HOSPITAL_COMMUNITY): Payer: Self-pay | Admitting: Psychiatry

## 2024-06-01 ENCOUNTER — Telehealth (INDEPENDENT_AMBULATORY_CARE_PROVIDER_SITE_OTHER): Admitting: Psychiatry

## 2024-06-01 DIAGNOSIS — F4322 Adjustment disorder with anxiety: Secondary | ICD-10-CM | POA: Diagnosis not present

## 2024-06-01 DIAGNOSIS — F331 Major depressive disorder, recurrent, moderate: Secondary | ICD-10-CM

## 2024-06-01 DIAGNOSIS — F411 Generalized anxiety disorder: Secondary | ICD-10-CM | POA: Diagnosis not present

## 2024-06-01 MED ORDER — HYDROXYZINE PAMOATE 25 MG PO CAPS: 25.0000 mg | ORAL_CAPSULE | Freq: Every day | ORAL | 2 refills | Status: DC | PRN

## 2024-06-01 MED ORDER — TRAZODONE HCL 50 MG PO TABS: ORAL_TABLET | ORAL | 0 refills | Status: DC

## 2024-06-01 NOTE — Progress Notes (Signed)
 [facility] Follow up visit   Patient Identification: Stephanie Jackson MRN:  [id number] Date of Evaluation:  06/01/2024 Referral Source: dr. Philis Chief Complaint:  follow up , med review and anxiety Visit Diagnosis:    ICD-10-CM   1. Moderate episode of recurrent major depressive disorder (HCC)  F33.1     2. Generalized anxiety disorder  F41.1     3. Adjustment disorder with anxious mood  F43.22     Virtual Visit via Video Note  I connected with Harlan N Scala on 06/01/24 at  3:30 PM EDT by a video enabled telemedicine application and verified that I am speaking with the correct person using two identifiers.  Location: Patient: outside car Provider: home office   I discussed the limitations of evaluation and management by telemedicine and the availability of in person appointments. The patient expressed understanding and agreed to proceed.     I discussed the assessment and treatment plan with the patient. The patient was provided an opportunity to ask questions and all were answered. The patient agreed with the plan and demonstrated an understanding of the instructions.   The patient was advised to call back or seek an in-person evaluation if the symptoms worsen or if the condition fails to improve as anticipated.  I provided 16 minutes of non-face-to-face time during this encounter.     History of Present Illness: Patient is 23 years old female diagnosed with depressive disorder and anxiety along with adjustment Currently lives with Fiance and his parents   On evaluation patient is doing better-last visit she was feeling subdued Effexor  was increased now at a dose of 150 mg.  Bonding is going on well baby is 56 months old  Mom is a good support Aggravating factors; difficult childhood  Modifying factors; mom, Fianc  Duration since age 41-15   Severity : Better  Past Psychiatric History: depression, anxiety  Previous Psychotropic Medications: Yes   Substance  Abuse History in the last 12 months:  No.  Consequences of Substance Abuse: NA  Past Medical History:  Past Medical History:  Diagnosis Date   Anxiety    Asthma    Depression     Past Surgical History:  Procedure Laterality Date   ureteral dilation      Family Psychiatric History: mom : bipolar  Family History: History reviewed. No pertinent family history.  Social History:   Social History   Socioeconomic History   Marital status: Single    Spouse name: Not on file   Number of children: Not on file   Years of education: Not on file   Highest education level: Not on file  Occupational History   Not on file  Tobacco Use   Smoking status: Never   Smokeless tobacco: Never  Vaping Use   Vaping status: Never Used  Substance and Sexual Activity   Alcohol use: No   Drug use: No   Sexual activity: Never  Other Topics Concern   Not on file  Social History Narrative   Not on file   Social Drivers of Health   Financial Resource Strain: Low Risk  (12/06/2023)   Received [facility]   Overall Financial Resource Strain (CARDIA)    Difficulty of Paying Living Expenses: Not hard at all  Recent Concern: Financial Resource Strain - Medium Risk (09/17/2023)   Received from [facility]   Overall Financial Resource Strain (CARDIA)    Difficulty of Paying Living Expenses: Somewhat hard  Food Insecurity: No Food Insecurity (  12/06/2023)   Received from [facility]   Hunger Vital Sign    Within the past 12 months, you worried that your food would run out before you got the money to buy more.: Never true    Within the past 12 months, the food you bought just didn't last and you didn't have money to get more.: Never true  Recent Concern: Food Insecurity - Food Insecurity Present (09/17/2023)   Received from [facility]   Hunger Vital Sign    Worried About Running Out of Food in the Last Year: Sometimes true    Ran Out of Food in the Last Year: Sometimes true   Transportation Needs: No Transportation Needs (12/06/2023)   Received from [facility] - Transportation    Lack of Transportation (Medical): No    Lack of Transportation (Non-Medical): No  Physical Activity: Insufficiently Active (09/04/2023)   Received from [facility]   Exercise Vital Sign    On average, how many days per week do you engage in moderate to strenuous exercise (like a brisk walk)?: 3 days    On average, how many minutes do you engage in exercise at this level?: 20 min  Stress: Stress Concern Present (12/06/2023)   Received from [facility] - Occupational Stress Questionnaire    Feeling of Stress : To some extent  Social Connections: Socially Integrated (09/04/2023)   Received from [facility]   Social Network    How would you rate your social network (family, work, friends)?: Good participation with social networks     Allergies:  No Known Allergies  Metabolic Disorder Labs: No results found for: HGBA1C, MPG No results found for: PROLACTIN No results found for: CHOL, TRIG, HDL, CHOLHDL, VLDL, LDLCALC No results found for: TSH  Therapeutic Level Labs: No results found for: LITHIUM No results found for: CBMZ No results found for: VALPROATE  Current Medications: Current Outpatient Medications  Medication Sig Dispense Refill   albuterol  (VENTOLIN  HFA) 108 (90 Base) MCG/ACT inhaler Inhale 2 puffs into the lungs every 4 (four) hours as needed for wheezing or shortness of breath (cough, shortness of breath or wheezing.). 6.7 g 3   hydrOXYzine  (VISTARIL ) 25 MG capsule Take 1 capsule (25 mg total) by mouth daily as needed. 30 capsule 2   NIKKI 3-0.02 MG tablet Take 1 tablet by mouth daily.     ondansetron  (ZOFRAN -ODT) 8 MG disintegrating tablet Take 1 tablet (8 mg total) by mouth every 8 (eight) hours as needed for nausea. 12 tablet 0   traZODone  (DESYREL ) 50 MG tablet TAKE 1/2  TABLET BY MOUTH AT BEDTIME 45 tablet 0   venlafaxine  XR (EFFEXOR -XR) 150 MG 24 hr capsule TAKE ONE CAPSULE BY MOUTH DAILY IN ADDITION TO THE 37.5MG  90 capsule 0   No current facility-administered medications for this visit.     Psychiatric Specialty Exam: Review of Systems  Cardiovascular:  Negative for chest pain.  Psychiatric/Behavioral:  Negative for agitation, dysphoric mood, hallucinations and sleep disturbance.     There were no vitals taken for this visit.There is no height or weight on file to calculate BMI.  General Appearance: Casual  Eye Contact:  Fair  Speech:  Normal Rate  Volume:  Decreased  Mood: Better  Affect:  Constricted  Thought Process:  Goal Directed  Orientation:  Full (Time, Place, and Person)  Thought Content:  Rumination  Suicidal Thoughts:  No  Homicidal Thoughts:  No  Memory:  Immediate;   Fair  Judgement:  Fair  Insight:  Fair  Psychomotor Activity:  Normal  Concentration:  Concentration: Good  Recall:  Good  Fund of Knowledge:Good  Language: Fair  Akathisia:  No  Handed:    AIMS (if indicated):  not done  Assets:  Desire for Improvement Housing Physical Health  ADL's:  Intact  Cognition: WNL  Sleep:  Good   Screenings: GAD-7    Flowsheet Row Office Visit from 09/11/2017 in [facility]  Total GAD-7 Score 12   PHQ2-9    Flowsheet Row Office Visit from 05/24/2021 in [facility] at [facility] Video Visit from 12/13/2020 in [facility] at [facility] Office Visit from 09/11/2017 in [facility] at [place]  PHQ-2 Total Score 1 4 1   PHQ-9 Total Score -- 11 --   Flowsheet Row Video Visit from 01/27/2024 in [facility] at [facility] Video Visit from 12/24/2023 in [facility] at [facility] Video  Visit from 10/27/2023 in [facility] at [facility]  C-SSRS RISK CATEGORY No Risk No Risk No Risk    Assessment and Plan: as follows Prior documentation reviewed  Major depressive disorder recurrent moderate; improved mood continue Effexor  at 150 mg Mom is good support as activities during the daytime bonding is going on well  Generalized anxiety disorder; anxiety is improved continue Effexor   Adjustment disorder:with delayed sleep, ; does not endorse sleep issues continue sleep hygiene   Follow-up in 3 months for new medication review call for earlier any concerns   Jackey Flight, MD 9/23/20253:42 PM

## 2024-06-21 MED ORDER — VENLAFAXINE HCL ER 150 MG PO CP24: ORAL_CAPSULE | ORAL | 0 refills | Status: DC

## 2024-06-21 MED ORDER — HYDROXYZINE PAMOATE 25 MG PO CAPS: 25.0000 mg | ORAL_CAPSULE | Freq: Every day | ORAL | 2 refills | Status: DC | PRN

## 2024-06-21 MED ORDER — TRAZODONE HCL 50 MG PO TABS: ORAL_TABLET | ORAL | 0 refills | Status: DC

## 2024-06-24 ENCOUNTER — Other Ambulatory Visit (HOSPITAL_COMMUNITY): Payer: Self-pay | Admitting: Psychiatry

## 2024-08-30 ENCOUNTER — Telehealth (INDEPENDENT_AMBULATORY_CARE_PROVIDER_SITE_OTHER): Admitting: Psychiatry

## 2024-08-30 ENCOUNTER — Encounter (HOSPITAL_COMMUNITY): Payer: Self-pay | Admitting: Psychiatry

## 2024-08-30 DIAGNOSIS — F4322 Adjustment disorder with anxiety: Secondary | ICD-10-CM | POA: Diagnosis not present

## 2024-08-30 DIAGNOSIS — Z79899 Other long term (current) drug therapy: Secondary | ICD-10-CM

## 2024-08-30 DIAGNOSIS — F331 Major depressive disorder, recurrent, moderate: Secondary | ICD-10-CM | POA: Diagnosis not present

## 2024-08-30 DIAGNOSIS — F411 Generalized anxiety disorder: Secondary | ICD-10-CM | POA: Diagnosis not present

## 2024-08-30 MED ORDER — VENLAFAXINE HCL ER 150 MG PO CP24: ORAL_CAPSULE | ORAL | 0 refills | Status: AC

## 2024-08-30 MED ORDER — HYDROXYZINE PAMOATE 25 MG PO CAPS: 25.0000 mg | ORAL_CAPSULE | Freq: Every day | ORAL | 0 refills | Status: AC | PRN

## 2024-08-30 NOTE — Progress Notes (Signed)
 " [facility] Follow up visit   Patient Identification: Stephanie Jackson MRN:  [id number] Date of Evaluation:  08/30/2024 Referral Source: dr. Philis Chief Complaint:  follow up , med review and anxiety Visit Diagnosis:    ICD-10-CM   1. Moderate episode of recurrent major depressive disorder (HCC)  F33.1     2. Generalized anxiety disorder  F41.1     3. Adjustment disorder with anxious mood  F43.22         Virtual Visit via Video Note  I connected with [name] on 08/30/2024 at  4:30 PM EST by a video enabled telemedicine application and verified that I am speaking with the correct person using two identifiers.  Location: Patient: home Provider: home office   I discussed the limitations of evaluation and management by telemedicine and the availability of in person appointments. The patient expressed understanding and agreed to proceed.     I discussed the assessment and treatment plan with the patient. The patient was provided an opportunity to ask questions and all were answered. The patient agreed with the plan and demonstrated an understanding of the instructions.   The patient was advised to call back or seek an in-person evaluation if the symptoms worsen or if the condition fails to improve as anticipated.  I provided 16 minutes of non-face-to-face time during this encounter.   History of Present Illness: Patient is 23 years old female diagnosed with depressive disorder and anxiety along with adjustment Currently lives with Fiance and his parents   On evaluation patient is doing reasonable she has a 94-month-old baby describes bonding is going on well  She has a snake as a administrator, arts and also 3 dogs fianc and family supportive patient has a supportive mom as well  Tolerating medication described depression anxiety is manageable  Aggravating factors; difficult childhood  Modifying factors; mom, Fianc  Duration since age [age]   Severity : Better  Past Psychiatric  History: depression, anxiety  Previous Psychotropic Medications: Yes   Substance Abuse History in the last 12 months:  No.  Consequences of Substance Abuse: NA  Past Medical History:  Past Medical History:  Diagnosis Date   Anxiety    Asthma    Depression     Past Surgical History:  Procedure Laterality Date   ureteral dilation      Family Psychiatric History: mom : bipolar  Family History: History reviewed. No pertinent family history.  Social History:   Social History   Socioeconomic History   Marital status: Single    Spouse name: Not on file   Number of children: Not on file   Years of education: Not on file   Highest education level: Not on file  Occupational History   Not on file  Tobacco Use   Smoking status: Never   Smokeless tobacco: Never  Vaping Use   Vaping status: Never Used  Substance and Sexual Activity   Alcohol use: No   Drug use: No   Sexual activity: Never  Other Topics Concern   Not on file  Social History Narrative   Not on file   Social Drivers of Health   Tobacco Use: Low Risk (08/30/2024)   Patient History    Smoking Tobacco Use: Never    Smokeless Tobacco Use: Never    Passive Exposure: Not on file  Financial Resource Strain: Low Risk (12/06/2023)   Received from [facility]   Overall Financial Resource Strain (CARDIA)    Difficulty of Paying Living  Expenses: Not hard at all  Recent Concern: Financial Resource Strain - Medium Risk (09/17/2023)   Received from [facility]   Overall Financial Resource Strain (CARDIA)    Difficulty of Paying Living Expenses: Somewhat hard  Food Insecurity: Low Risk (07/23/2024)   Received from [facility]   Epic    Within the past 12 months, you worried that your food would run out before you got money to buy more: Patient declined to answer    Within the past 12 months, the food you bought just didn't last and you didn't have money to get more. : Never true  Transportation Needs: Not on  file (07/23/2024)  Physical Activity: Insufficiently Active (09/04/2023)   Received from [facility]   Exercise Vital Sign    On average, how many days per week do you engage in moderate to strenuous exercise (like a brisk walk)?: 3 days    On average, how many minutes do you engage in exercise at this level?: 20 min  Stress: Stress Concern Present (12/06/2023)   Received from [facility] - Occupational Stress Questionnaire    Feeling of Stress : To some extent  Social Connections: Socially Integrated (09/04/2023)   Received from [facility]   Social Network    How would you rate your social network (family, work, friends)?: Good participation with social networks  Depression (PHQ2-9): Not on file  Alcohol Screen: Not on file  Housing: Low Risk (07/23/2024)   Received from [facility]   Epic    What is your living situation today?: I have a steady place to live    Think about the place you live. Do you have problems with any of the following? Choose all that apply:: None/None on this list  Utilities: Low Risk (07/23/2024)   Received from [facility]   Utilities    In the past 12 months has the electric, gas, oil, or water company threatened to shut off services in your home? : No  Health Literacy: Not on file     Allergies:  No Known Allergies  Metabolic Disorder Labs: No results found for: HGBA1C, MPG No results found for: PROLACTIN No results found for: CHOL, TRIG, HDL, CHOLHDL, VLDL, LDLCALC No results found for: TSH  Therapeutic Level Labs: No results found for: LITHIUM No results found for: CBMZ No results found for: VALPROATE  Current Medications: Current Outpatient Medications  Medication Sig Dispense Refill   albuterol  (VENTOLIN  HFA) 108 (90 Base) MCG/ACT inhaler Inhale 2 puffs into the lungs every 4 (four) hours as needed for wheezing or shortness of breath (cough, shortness of breath  or wheezing.). 6.7 g 3   hydrOXYzine  (VISTARIL ) 25 MG capsule Take 1 capsule (25 mg total) by mouth daily as needed. 90 capsule 0   NIKKI 3-0.02 MG tablet Take 1 tablet by mouth daily.     ondansetron  (ZOFRAN -ODT) 8 MG disintegrating tablet Take 1 tablet (8 mg total) by mouth every 8 (eight) hours as needed for nausea. 12 tablet 0   venlafaxine  XR (EFFEXOR -XR) 150 MG 24 hr capsule TAKE ONE CAPSULE BY MOUTH DAILY 90 capsule 0   No current facility-administered medications for this visit.     Psychiatric Specialty Exam: Review of Systems  Cardiovascular:  Negative for chest pain.  Psychiatric/Behavioral:  Negative for agitation, dysphoric mood, hallucinations and sleep disturbance.     There were no vitals taken for this visit.There is no height or weight on file to  calculate BMI.  General Appearance: Casual  Eye Contact:  Fair  Speech:  Normal Rate  Volume:  Decreased  Mood: Better  Affect:  Constricted  Thought Process:  Goal Directed  Orientation:  Full (Time, Place, and Person)  Thought Content:  Rumination  Suicidal Thoughts:  No  Homicidal Thoughts:  No  Memory:  Immediate;   Fair  Judgement:  Fair  Insight:  Fair  Psychomotor Activity:  Normal  Concentration:  Concentration: Good  Recall:  Good  Fund of Knowledge:Good  Language: Fair  Akathisia:  No  Handed:    AIMS (if indicated):  not done  Assets:  Desire for Improvement Housing Physical Health  ADL's:  Intact  Cognition: WNL  Sleep:  Good   Screenings: GAD-7    Flowsheet Row Office Visit from 09/11/2017 in [place] at [facility]  Total GAD-7 Score 12   PHQ2-9    Flowsheet Row Office Visit from 05/24/2021 in [facility] Video Visit from 12/13/2020 in [facility] at [facility] Office Visit from 09/11/2017 in [facility] at [facility]   PHQ-2 Total Score 1 4 1   PHQ-9 Total Score -- 11 --   Flowsheet Row Video Visit from 08/30/2024 in [facility] at [facility] Video Visit from 01/27/2024 in [facility] at [facility] Video Visit from 12/24/2023 in [facility]  C-SSRS RISK CATEGORY No Risk No Risk No Risk    Assessment and Plan: as follows Prior documentation reviewed  Major depressive disorder recurrent moderate; remains stable continue Effexor  150 mg mom is a good support also bonding is going on well  Risk and side effects of medications if any and while during breast-feeding discussed and patient aware generalized anxiety disorder; manageable continue Effexor  she seldom takes hydroxyzine  but overall sometimes takes it for night and also helps with the daytime anxiety  Adjustment disorder:with delayed sleep, patient not using trazodone  continue sleep hygiene   Follow-up in 3 months for new medication review call for earlier any concerns   Jackey Flight, MD 12/22/20254:33 PM  "

## 2024-09-30 ENCOUNTER — Other Ambulatory Visit: Payer: Self-pay | Admitting: Medical Genetics

## 2024-09-30 DIAGNOSIS — Z006 Encounter for examination for normal comparison and control in clinical research program: Secondary | ICD-10-CM

## 2024-10-07 ENCOUNTER — Other Ambulatory Visit (HOSPITAL_COMMUNITY): Payer: Self-pay | Admitting: Psychiatry

## 2024-11-29 ENCOUNTER — Telehealth (HOSPITAL_COMMUNITY): Admitting: Psychiatry
